# Patient Record
Sex: Female | Born: 1985 | Hispanic: Yes | Marital: Single | Smoking: Former smoker
Health system: Southern US, Community
[De-identification: ages and names within clinical notes are randomized; demographics above are authoritative.]

## PROBLEM LIST (undated history)

## (undated) DIAGNOSIS — F99 Mental disorder, not otherwise specified: Secondary | ICD-10-CM

## (undated) HISTORY — DX: Mental disorder, not otherwise specified: F99

---

## 2011-10-18 ENCOUNTER — Ambulatory Visit
Admission: RE | Admit: 2011-10-18 | Discharge: 2011-10-18 | Disposition: A | Discharge status: Home / Self Care | Payer: PRIVATE HEALTH INSURANCE | Source: Ambulatory Visit | Attending: Family Medicine | Admitting: Family Medicine

## 2011-10-18 ENCOUNTER — Other Ambulatory Visit: Payer: Self-pay | Admitting: Family Medicine

## 2011-10-18 DIAGNOSIS — Z0000 Encounter for general adult medical examination without abnormal findings: Secondary | ICD-10-CM

## 2019-04-30 ENCOUNTER — Other Ambulatory Visit: Payer: Self-pay | Admitting: Internal Medicine

## 2019-04-30 ENCOUNTER — Ambulatory Visit
Admission: RE | Admit: 2019-04-30 | Discharge: 2019-04-30 | Disposition: A | Discharge status: Home / Self Care | Payer: Self-pay | Source: Ambulatory Visit | Attending: Internal Medicine | Admitting: Internal Medicine

## 2019-04-30 ENCOUNTER — Other Ambulatory Visit: Payer: Self-pay

## 2019-04-30 DIAGNOSIS — R7611 Nonspecific reaction to tuberculin skin test without active tuberculosis: Secondary | ICD-10-CM

## 2019-05-06 ENCOUNTER — Other Ambulatory Visit: Payer: No Typology Code available for payment source

## 2019-05-06 ENCOUNTER — Encounter: Payer: Self-pay | Admitting: Family Medicine

## 2019-05-06 ENCOUNTER — Other Ambulatory Visit: Payer: Self-pay

## 2019-05-06 ENCOUNTER — Ambulatory Visit (INDEPENDENT_AMBULATORY_CARE_PROVIDER_SITE_OTHER): Payer: Self-pay | Admitting: Family Medicine

## 2019-05-06 VITALS — BP 108/70 | Ht 66.0 in | Wt 230.2 lb

## 2019-05-06 DIAGNOSIS — Y9315 Activity, underwater diving and snorkeling: Secondary | ICD-10-CM

## 2019-05-06 DIAGNOSIS — Z0189 Encounter for other specified special examinations: Secondary | ICD-10-CM

## 2019-05-06 DIAGNOSIS — R918 Other nonspecific abnormal finding of lung field: Secondary | ICD-10-CM

## 2019-05-06 LAB — POCT UA - GLUCOSE/PROTEIN
Glucose, UA: NEGATIVE
Protein, UA: NEGATIVE

## 2019-05-06 LAB — GLUCOSE, POCT (MANUAL RESULT ENTRY): POC Glucose: 98 mg/dl (ref 70–99)

## 2019-05-06 LAB — POCT HEMOGLOBIN: Hemoglobin: 11.9 g/dL (ref 11–14.6)

## 2019-05-06 NOTE — Progress Notes (Signed)
PCP: Patient, No Pcp Per  Subjective:   HPI: Patient is a 34 y.o. female here for dive physical.  Patient will be diving at [facility]. Has been certified scuba diver since around 2010. She denies history of barotrauma, seizures, pneumothorax, sinus problems, neurologic disorders, cancer, hearing problems and tinnitus, asthma, panic attacks. She wears contacts regularly - vision 20/15 each eye with correction Allergic to sulfa. Smoke <1 pack per week Father with history of diabetes She takes escitalopram 20mg , adderall 20mg , klonazepam 0.5mg  as needed.    History reviewed. No pertinent past medical history.  No current outpatient medications on file prior to visit.   No current facility-administered medications on file prior to visit.    History reviewed. No pertinent surgical history.  Not on File  Social History   Socioeconomic History  . Marital status: Single    Spouse name: Not on file  . Number of children: Not on file  . Years of education: Not on file  . Highest education level: Not on file  Occupational History  . Not on file  Tobacco Use  . Smoking status: Not on file  Substance and Sexual Activity  . Alcohol use: Not on file  . Drug use: Not on file  . Sexual activity: Not on file  Other Topics Concern  . Not on file  Social History Narrative  . Not on file   Social Determinants of Health   Financial Resource Strain:   . Difficulty of Paying Living Expenses: Not on file  Food Insecurity:   . Worried About in the Last Year: Not on file  . Ran Out of Food in the Last Year: Not on file  Transportation Needs:   . Lack of Transportation (Medical): Not on file  . Lack of Transportation (Non-Medical): Not on file  Physical Activity:   . Days of Exercise per Week: Not on file  . Minutes of Exercise per Session: Not on file  Stress:   . Feeling of Stress : Not on file  Social Connections:   . Frequency of  Communication with Friends and Family: Not on file  . Frequency of Social Gatherings with Friends and Family: Not on file  . Attends Religious Services: Not on file  . Active Member of Clubs or Organizations: Not on file  . Attends Meetings: Not on file  . Marital Status: Not on file  Intimate Partner Violence:   . Fear of Current or Ex-Partner: Not on file  . Emotionally Abused: Not on file  . Physically Abused: Not on file  . Sexually Abused: Not on file    History reviewed. No pertinent family history.  BP 108/70   Ht 5\' 6"  (1.676 m)   Wt 230 lb 3.2 oz (104.4 kg)   BMI 37.16 kg/m   Review of Systems: See HPI above.     Objective:  Physical Exam:  Gen: NAD, comfortable in exam room  Vision 20/15 each eye with correction HEENT: EOMI, PERRL. Visual fields normal each eye.  TMs clear without bulging, retraction, or erythema.  Pharynx normal CV: RRR no MRG seated or standing Lungs: CTAB without wheezes, rales, rhonchi Pulses: normal radial pulses bilaterally Abd: soft, nt, nd. No HSM Neuro: CN 2-12 grossly intact MSKs 3+ patellar, achilles, brachioradialis, biceps, triceps tendons bilaterally Strength 5/5 upper and lower extremity muscle groups Romberg negative Double leg stance 0 errors, single leg and tandem 1 error each Finger to nose normal  bilaterally Horizontal saccades and vertical saccades 20 trials without symptoms or nystagmus Fixed gaze with head rotation 20 trials without symptoms or nystagmus   EKG: normal sinus rhythm.  No ST-T wave changes.  No other abnormalities. UA: negative protein and glucose Serum glucose and hemoglobin normal CXR: Opacity visualized at right cardiophrenic angle.    Assessment & Plan:  1. Dive physical - Patient has an opacity at the right cardiophrenic angle on her CXR.  Has had x-rays previously for diving and has not been told of this previously.  Will go ahead with CT with IV contrast to further evaluate.   Will temporarily hold clearance for diving until she completes this workup.

## 2019-05-07 ENCOUNTER — Encounter: Payer: Self-pay | Admitting: Family Medicine

## 2019-05-07 ENCOUNTER — Ambulatory Visit
Admission: RE | Admit: 2019-05-07 | Discharge: 2019-05-07 | Disposition: A | Discharge status: Home / Self Care | Payer: No Typology Code available for payment source | Source: Ambulatory Visit | Attending: Family Medicine | Admitting: Family Medicine

## 2019-05-07 DIAGNOSIS — Y9315 Activity, underwater diving and snorkeling: Secondary | ICD-10-CM

## 2019-05-07 DIAGNOSIS — Z0189 Encounter for other specified special examinations: Secondary | ICD-10-CM

## 2019-05-15 ENCOUNTER — Ambulatory Visit
Admission: RE | Admit: 2019-05-15 | Discharge: 2019-05-15 | Disposition: A | Discharge status: Home / Self Care | Payer: Self-pay | Source: Ambulatory Visit | Attending: Family Medicine | Admitting: Family Medicine

## 2019-05-15 DIAGNOSIS — R918 Other nonspecific abnormal finding of lung field: Secondary | ICD-10-CM

## 2019-05-15 MED ORDER — IOPAMIDOL (ISOVUE-300) INJECTION 61%
75.0000 mL | Freq: Once | INTRAVENOUS | Status: AC | PRN
  Administered 2019-05-15: 15:00:00 75 mL via INTRAVENOUS

## 2020-10-28 ENCOUNTER — Other Ambulatory Visit: Payer: Self-pay

## 2020-10-28 ENCOUNTER — Encounter: Payer: Self-pay | Admitting: Family Medicine

## 2020-10-28 ENCOUNTER — Ambulatory Visit (INDEPENDENT_AMBULATORY_CARE_PROVIDER_SITE_OTHER): Payer: Self-pay | Admitting: Family Medicine

## 2020-10-28 VITALS — BP 138/88 | Ht 66.0 in | Wt 240.0 lb

## 2020-10-28 DIAGNOSIS — Z87898 Personal history of other specified conditions: Secondary | ICD-10-CM

## 2020-10-28 DIAGNOSIS — Z0189 Encounter for other specified special examinations: Secondary | ICD-10-CM

## 2020-10-28 DIAGNOSIS — F17200 Nicotine dependence, unspecified, uncomplicated: Secondary | ICD-10-CM

## 2020-10-28 DIAGNOSIS — Y9315 Activity, underwater diving and snorkeling: Secondary | ICD-10-CM

## 2020-10-28 NOTE — Progress Notes (Signed)
PCP: Patient, No Pcp Per (Inactive)  Subjective:   HPI: Patient is a 35 y.o. female here for dive physical clearance.  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 She denies any history of barotrauma, seizure, pneumothorax, sinus problems, neurologic disorders, cancer, hearing problems, tinnitus, asthma, panic attacks.   She does wear contacts regularly - vision recently was 20/15 each eye with correction.  She did have a follow-up CT scan after a prominent epicardial fat pad noted on x-ray. Her CT showed scattered < 73mm pulmonary nodules, seemingly benign per rads read. She is still smoking cigarettes, but reducing tobacco burden, she is now down to < 1 pack/week.  Patient did have COVID-19 on 09/14/2020. Patient reports her symptoms started with a headache/migraine for 3 days, followed by a few days of head cold/congestion. She reports stuffy/runny nose. She denied experiencing any fever, no anosmia or dysgeusia. She never experienced any chest pain, shortness of breath, or wheezing. Her symptoms lasted total for about 6-7 days and then she was back to usual activity.   Today, patient feels well without any above symptoms. She has been able to exercise, run, workout since COVID-19 and has not had any issues. She has not been diving yet, but is anxious to return.  Her current meds include: Lexapro 20mg , Adderall 20mg   PMHx: - GAD/MDD - Tobacco use disorder  Social History   Socioeconomic History   Marital status: Single    Spouse name: Not on file   Number of children: Not on file   Years of education: Not on file   Highest education level: Not on file  Occupational History   Not on file  Tobacco Use   Smoking status: Every Day    Pack years: 0.00   Smokeless tobacco: Never  Substance and Sexual Activity   Alcohol use: Not on file   Drug use: Not on file   Sexual activity: Not on file  Other Topics Concern   Not on file   Social History Narrative   Not on file   Social Determinants of Health   Financial Resource Strain: Not on file  Food Insecurity: Not on file  Transportation Needs: Not on file  Physical Activity: Not on file  Stress: Not on file  Social Connections: Not on file  Intimate Partner Violence: Not on file    No family history on file.  BP 138/88   Ht 5\' 6"  (1.676 m)   Wt 240 lb (108.9 kg)   BMI 38.74 kg/m   No flowsheet data found.  No flowsheet data found.  Review of Systems: See HPI above.     Objective:  Physical Exam:  Gen: Alert and Oriented x 3, NAD; non-toxic HEENT: Normocephalic, atraumatic, PERRLA, EOMI, TM visible with good light reflex, non-swollen, non-erythematous turbinates, non-erythematous pharyngeal mucosa, no exudates Neck: trachea midline, no thyroidmegaly, no LAD CV: RRR, no murmurs, normal S1, S2 split Resp: CTAB, no wheezing, rales, or rhonchi, comfortable work of breathing Abd: non-distended, non-tender, soft MSK: Moves all four extremities Ext: no clubbing, cyanosis, or edema Neuro: No gross deficits Skin: warm, dry, intact, no rashes    Assessment & Plan:  Dive Physical - cleared today on examination, clear to return paperwork signed by Dr. . Tobacco use disorder - improving, now down to < 1 pack/day. Total of 2 minutes discussed smoking cessation. Resources provided. Pulmonary nodules noted on CT scan 05/14/20 - scattered measuring up to 70mm, most likely  benign given age and characteristics. We did discuss and recommend repeat CXR to follow, however patient would like to hold off on further imaging for now 2/2 cost. We did discuss R/B/I of repeat imaging, patient is aware but would like to hold for now. We discussed repeat imaging (XR > CT) sometime in the next 1-2 years to monitor nodules. She is aware if any symptoms arise, to let us know and we can have this done sooner.   Madelyn Brunner, DO PGY-4, Sports Medicine Fellow [facility]

## 2020-10-28 NOTE — Patient Instructions (Addendum)
It was great to meet you today, thank you for letting me participate in your care!  Today, we discussed:  - clearance for diving --> you are clear to return to diving. When you first go, be aware of your breathing and if there are any differences ( I do not expect this) - great job reducing your smoking!! Keep going on this to hopefully quit completely. This will help with your lungs and overall health. If you need resources, please let us know. - Obtain x-ray to ensure the pulmonary nodules are stable, would recommend this at some point in the future (anywhere from now to 1-2 years)  You will follow-up as needed.  If you have any further questions, please give the clinic a call [phone].  Cheers,  Madelyn Brunner, DO PGY-4, Sports Medicine Fellow [facility]

## 2021-03-13 IMAGING — CR DG CHEST 1V
1 series · 1 of 1 positions shown · non-contrast
Comparison: October 18, 2011

CLINICAL DATA: Positive tuberculin skin test

EXAM:
CHEST  1 VIEW

[w chest pa]
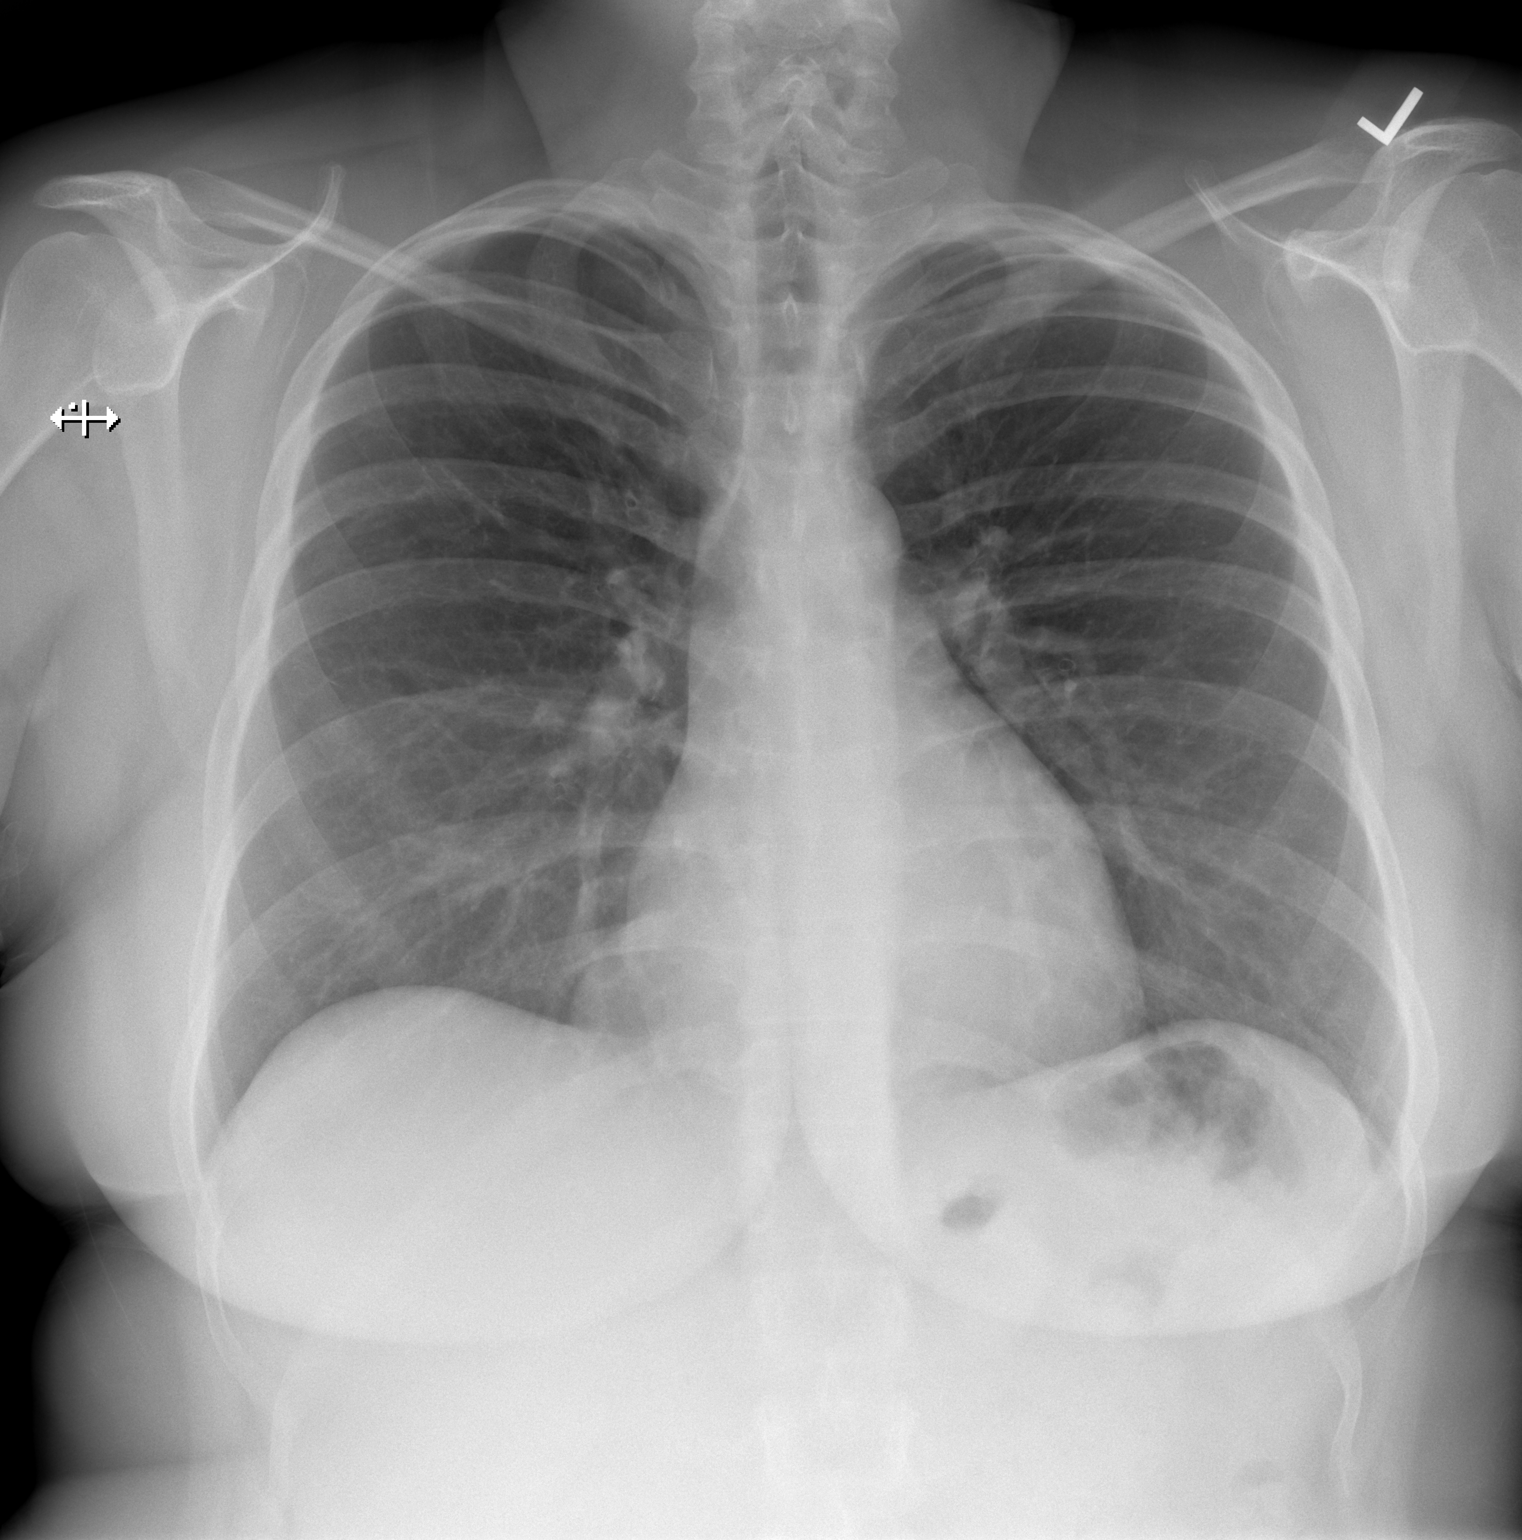

[1 of 1 positions shown; findings below may reference images not displayed]

FINDINGS: There is a nodular appearing opacity at the right heart border level
measuring 3.8 x 2.7 cm. Lungs elsewhere clear. Heart size and
pulmonary vascularity are normal. No adenopathy. No bone lesions.
IMPRESSION: 1. Opacity at the level of the right heart border, at the level of
the medial right hemidiaphragm. This opacity measures 3.8 x 2.7 cm.
This opacity is of uncertain etiology. It could represent a
pericardial cyst or duplication cyst. An atypical neoplasm
conceivably could present in this manner; it would be prudent to
correlate this finding with contrast enhanced chest CT to further
assess.

2. No lung edema or consolidation. No adenopathy. No findings
suggesting pulmonary tuberculosis.

These results will be called to the ordering clinician or
representative by the Radiologist Assistant, and communication
documented in the PACS or zVision Dashboard.

## 2021-03-28 IMAGING — CT CT CHEST W/ CM
2 of 4 series · 11 of 36 positions shown, 13 images · IV contrast (iopamidol)
Comparison: Chest x-ray dated May 07, 2019.

CLINICAL DATA: Possible mediastinal mass.

EXAM:
CT CHEST WITH CONTRAST
TECHNIQUE: Multidetector CT imaging of the chest was performed during
intravenous contrast administration.
CONTRAST:  75mL MLMC70-2RR IOPAMIDOL (MLMC70-2RR) INJECTION 61%

[Series 2: chest 2.00 br40 s3 · axial · 0.59mm/px · z∈[+1596,+1838]mm · 8 of 145 slices shown, 10 images (1 of 2)]
[im 12/145  mediastinal]
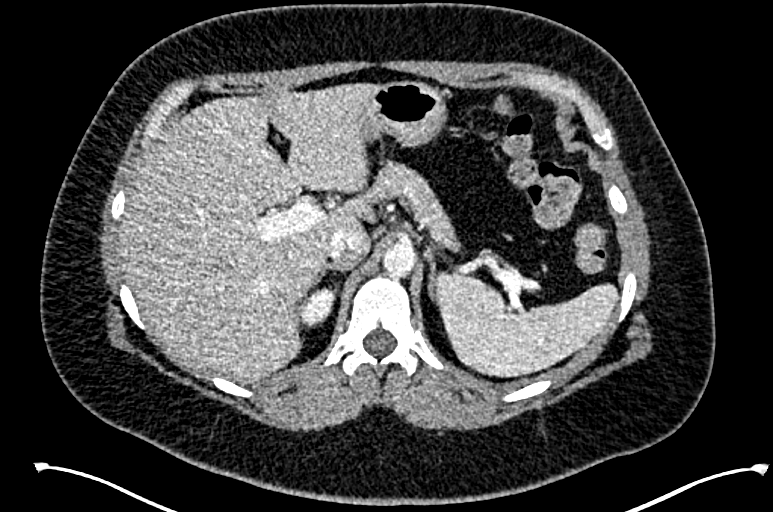
[im 12/145  lung]
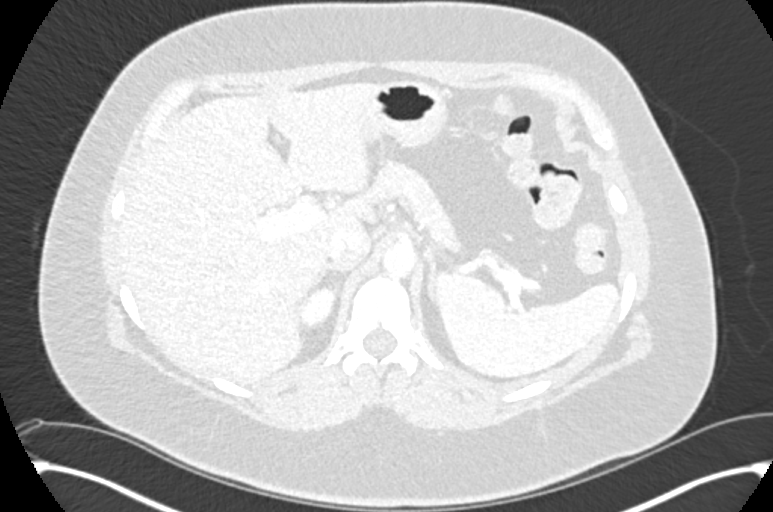
[im 34/145  lung]
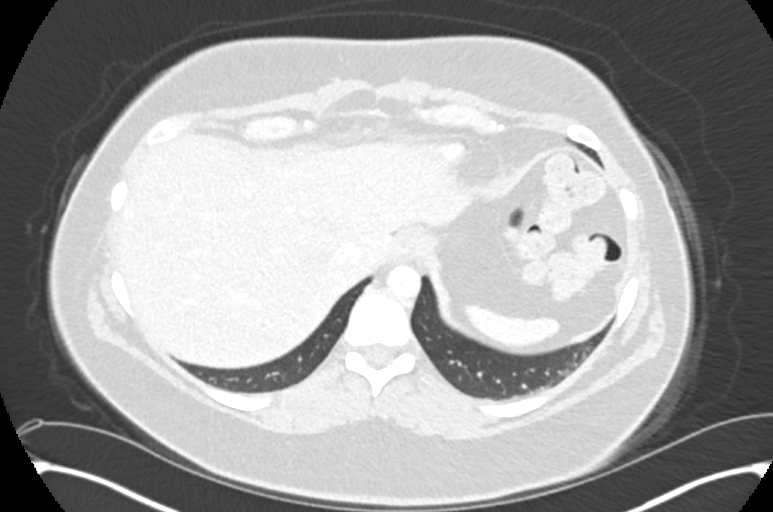
[im 45/145  lung]
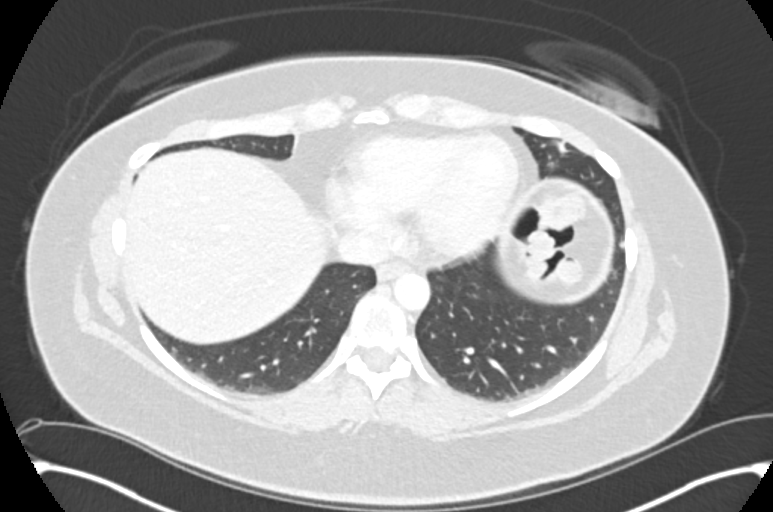
[im 67/145  lung]
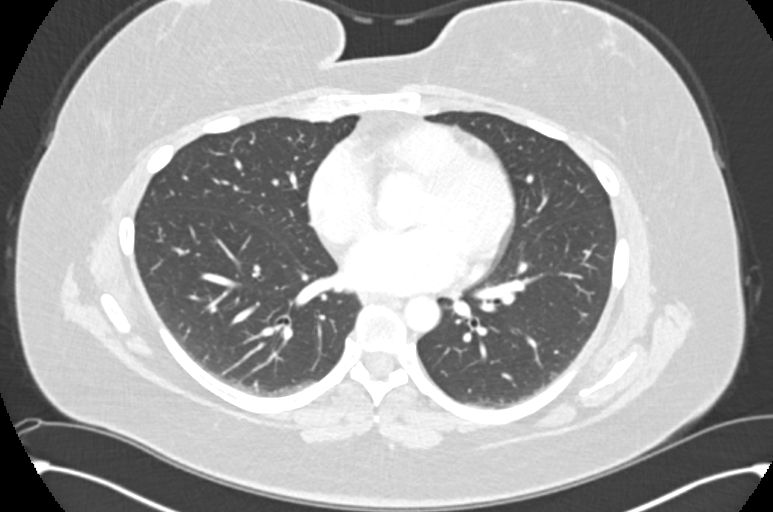
[im 78/145  mediastinal]
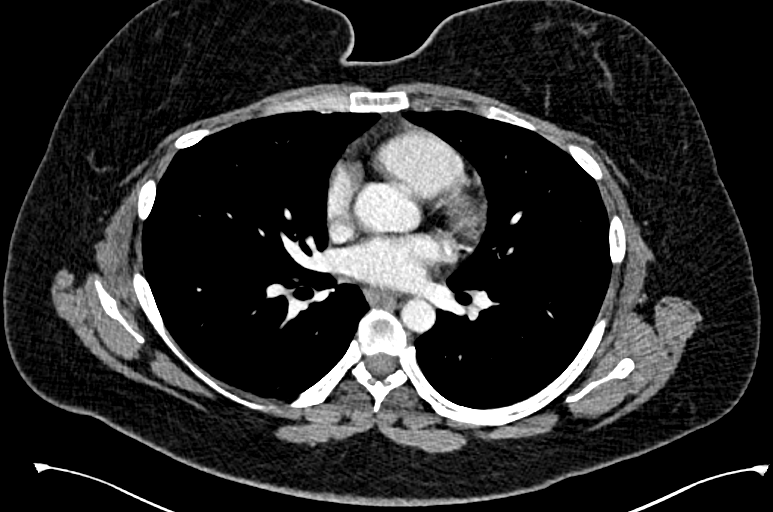
[im 78/145  lung]
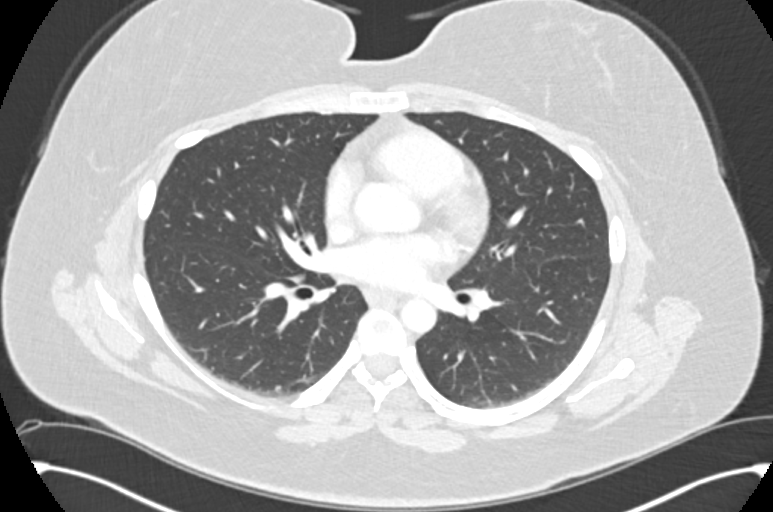
[im 100/145  lung]
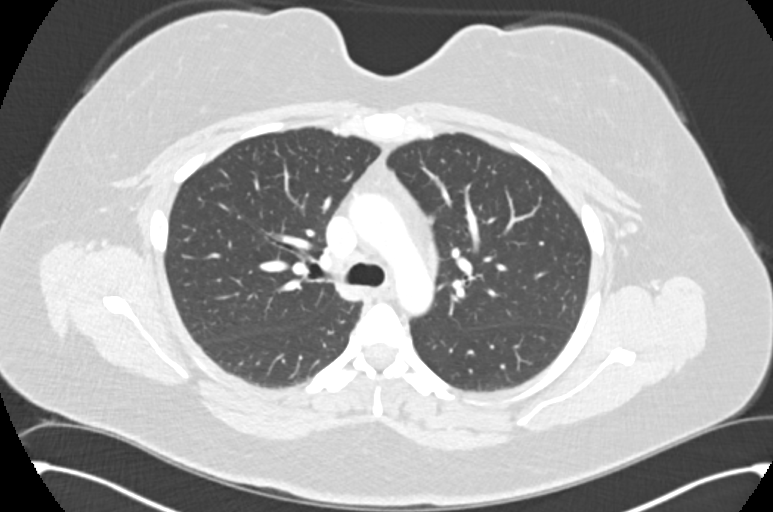
[im 111/145  lung]
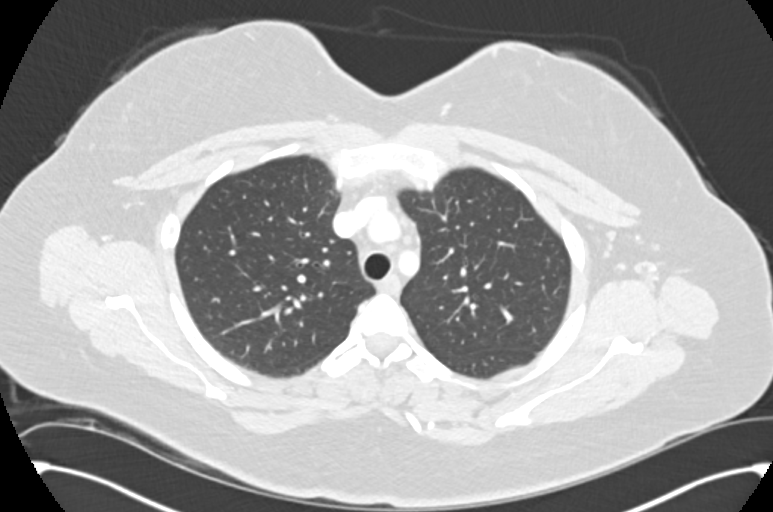
[im 133/145  lung]
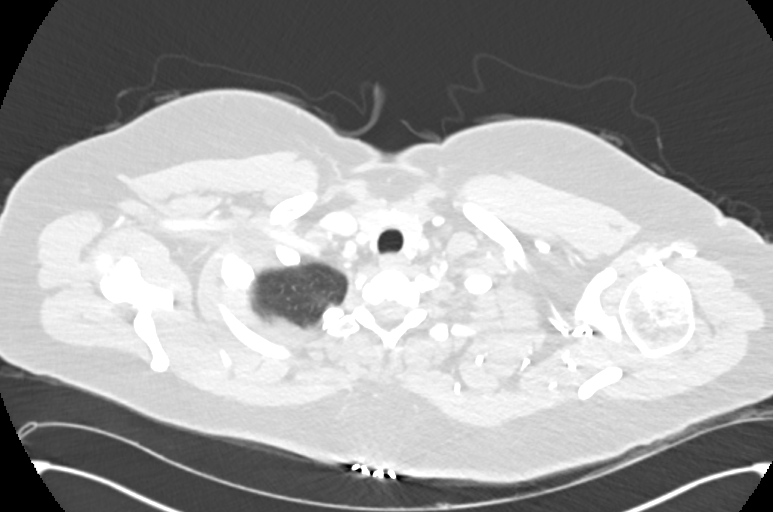

[Series 4: chest 2.00 br40 s3 · coronal · 0.57mm/px · 3 of 150 slices shown (2 of 2)]
[im 30/150  lung]
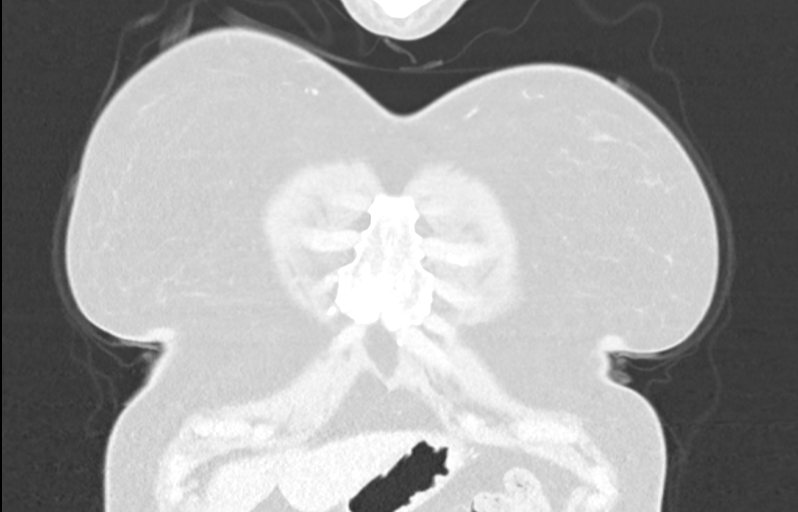
[im 60/150  lung]
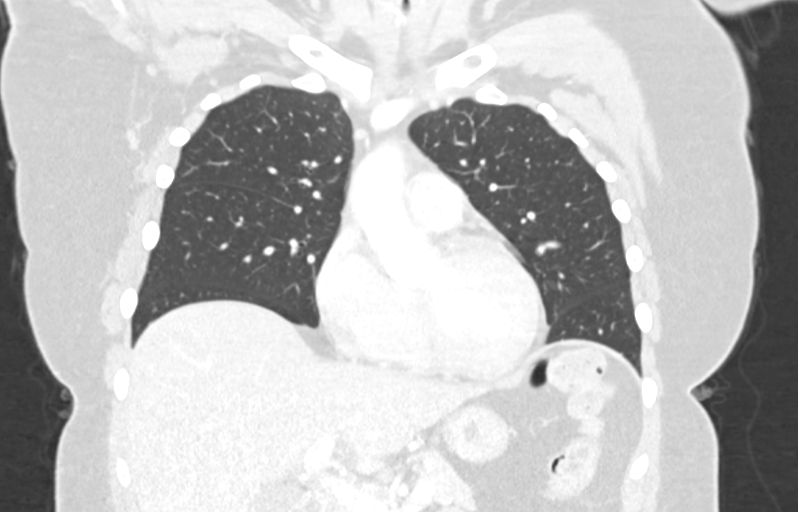
[im 90/150  lung]
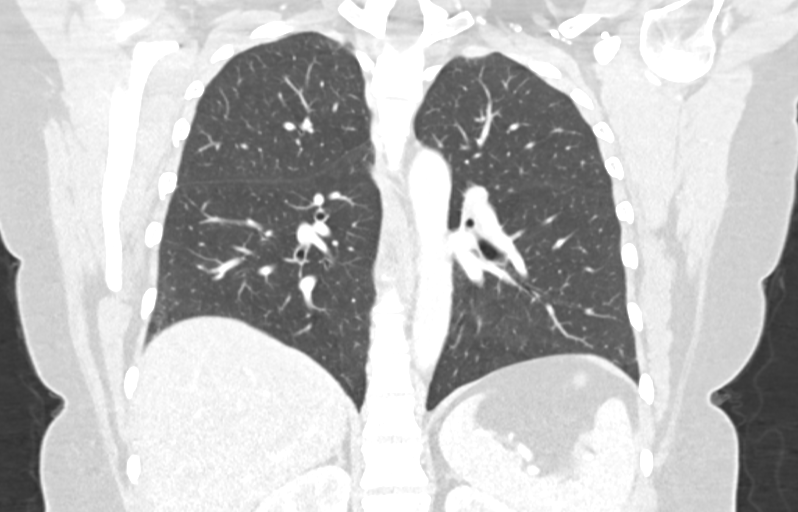

[11 of 36 positions shown; findings below may reference images not displayed]

FINDINGS: Cardiovascular: No significant vascular findings. Normal heart size.
No pericardial effusion. No thoracic aortic aneurysm or dissection.
No central pulmonary embolism.

Mediastinum/Nodes: The density seen on chest x-ray corresponds to
prominent epicardial fat at the right cardiophrenic angle. No
mediastinal mass. No enlarged mediastinal, hilar, or axillary lymph
nodes. Thyroid gland, trachea, and esophagus demonstrate no
significant findings.

Lungs/Pleura: 4 mm pulmonary nodule in the posterior right upper
lobe (series 8, image 27). 4 mm triangular subpleural nodule in the
anterior right middle lobe (series 8, image 75), possibly a lymph
node. 3 mm pulmonary nodule in the posterior right lower lobe
(series 8, image 60). 4 mm pulmonary nodule in the left lower lobe
(series 8, image 56). Focal scarring in the lingula. No focal
consolidation, pleural effusion, or pneumothorax.

Upper Abdomen: No acute abnormality.

Musculoskeletal: No chest wall abnormality. No acute or significant
osseous findings.
IMPRESSION: 1. No mediastinal mass. The density seen on chest x-ray corresponds
to prominent epicardial fat at the right cardiophrenic angle.
2. Scattered pulmonary nodules measuring up to 4 mm. These are
almost certainly benign given the patient's age. Please note that
Fleischner criteria do not apply to patients under the age of 35.
Non-contrast chest CT can be considered in 12 months if patient is
at high-risk for malignancy.

## 2021-05-31 ENCOUNTER — Other Ambulatory Visit: Payer: Self-pay

## 2021-05-31 ENCOUNTER — Encounter: Payer: Self-pay | Admitting: Obstetrics and Gynecology

## 2021-05-31 ENCOUNTER — Other Ambulatory Visit (HOSPITAL_COMMUNITY)
Admission: RE | Admit: 2021-05-31 | Discharge: 2021-05-31 | Disposition: A | Discharge status: Home / Self Care | Payer: 59 | Source: Ambulatory Visit | Attending: Obstetrics and Gynecology | Admitting: Obstetrics and Gynecology

## 2021-05-31 ENCOUNTER — Ambulatory Visit (INDEPENDENT_AMBULATORY_CARE_PROVIDER_SITE_OTHER): Payer: 59 | Admitting: Obstetrics and Gynecology

## 2021-05-31 VITALS — BP 108/73 | HR 74 | Ht 66.0 in | Wt 253.0 lb

## 2021-05-31 DIAGNOSIS — Z3042 Encounter for surveillance of injectable contraceptive: Secondary | ICD-10-CM | POA: Diagnosis not present

## 2021-05-31 DIAGNOSIS — Z01419 Encounter for gynecological examination (general) (routine) without abnormal findings: Secondary | ICD-10-CM | POA: Diagnosis not present

## 2021-05-31 MED ORDER — MEDROXYPROGESTERONE ACETATE 150 MG/ML IM SUSP: 150.0000 mg | INTRAMUSCULAR | 5 refills | Status: DC

## 2021-05-31 MED ORDER — MEDROXYPROGESTERONE ACETATE 150 MG/ML IM SUSP
150.0000 mg | Freq: Once | INTRAMUSCULAR | Status: AC
  Administered 2021-05-31: 150 mg via INTRAMUSCULAR

## 2021-05-31 NOTE — Progress Notes (Addendum)
Pt is in office to discuss changing BC.  Pt is currently on Depo x 5-6 years.  Last injection given 03/24/21.  Pt is unsure of last pap date, would like today.    Office stock depo given today.  Pt advised return for depo May 1-15.  Administrations This Visit     medroxyPROGESTERone (DEPO-PROVERA) injection 150 mg     Admin Date 05/31/2021 Action Given Dose 150 mg Route Intramuscular Administered By Lanney Gins, CMA

## 2021-05-31 NOTE — Progress Notes (Signed)
Subjective:     Bethany Schultz is a 36 y.o. female P0 with LMP 05/17/21 and BMI 40 who is here for a comprehensive physical exam. The patient reports no problems. She is not currently sexually active. She is on depo-provera for cycle control. She is considering changing contraception method as she has been on it for 5 years. She is taking calcium and vitamin D supplements. Patient is otherwise without complaints. She denies pelvic pain or abnormal discharge. Patient declines STI testing  Past Medical History:  Diagnosis Date   Mental disorder    History reviewed. No pertinent surgical history. Family History  Problem Relation Age of Onset   Diabetes Father    Hypothyroidism Mother      Social History   Socioeconomic History   Marital status: Single    Spouse name: Not on file   Number of children: Not on file   Years of education: Not on file   Highest education level: Not on file  Occupational History   Not on file  Tobacco Use   Smoking status: Every Day    Types: Cigarettes   Smokeless tobacco: Never   Tobacco comments:    1-2 cigs/day  Substance and Sexual Activity   Alcohol use: Not Currently   Drug use: Not Currently   Sexual activity: Not on file  Other Topics Concern   Not on file  Social History Narrative   Not on file   Social Determinants of Health   Financial Resource Strain: Not on file  Food Insecurity: Not on file  Transportation Needs: Not on file  Physical Activity: Not on file  Stress: Not on file  Social Connections: Not on file  Intimate Partner Violence: Not on file   Health Maintenance  Topic Date Due   COVID-19 Vaccine (1) Never done   HIV Screening  Never done   Hepatitis C Screening  Never done   TETANUS/TDAP  Never done   PAP SMEAR-Modifier  Never done   INFLUENZA VACCINE  Never done   HPV VACCINES  Aged Out       Review of Systems Pertinent items noted in HPI and remainder of comprehensive ROS otherwise negative.   Objective:   Blood pressure 108/73, pulse 74, height 5\' 6"  (1.676 m), weight 253 lb (114.8 kg), last menstrual period 05/17/2021.   GENERAL: Well-developed, well-nourished female in no acute distress.  HEENT: Normocephalic, atraumatic. Sclerae anicteric.  NECK: Supple. Normal thyroid.  LUNGS: Clear to auscultation bilaterally.  HEART: Regular rate and rhythm. BREASTS: Symmetric in size. No palpable masses or lymphadenopathy, skin changes, or nipple drainage. ABDOMEN: Soft, nontender, nondistended. No organomegaly. PELVIC: Normal external female genitalia. Vagina is pink and rugated.  Normal discharge. Normal appearing cervix. Uterus is normal in size. No adnexal mass or tenderness. Chaperone present during the pelvic exam EXTREMITIES: No cyanosis, clubbing, or edema, 2+ distal pulses.     Assessment:    Healthy female exam.      Plan:    Pap smear collected Contraception options discussed and patient opted to continue with depo-provera Patient will be contacted with abnormal results See After Visit Summary for Counseling Recommendations

## 2021-05-31 NOTE — Addendum Note (Signed)
Addended by: Marya Landry D on: 05/31/2021 10:00 AM   Modules accepted: Orders

## 2021-06-02 LAB — CYTOLOGY - PAP
Adequacy: ABSENT
Comment: NEGATIVE
Diagnosis: NEGATIVE
High risk HPV: NEGATIVE

## 2021-08-27 ENCOUNTER — Ambulatory Visit (INDEPENDENT_AMBULATORY_CARE_PROVIDER_SITE_OTHER): Payer: 59 | Admitting: *Deleted

## 2021-08-27 DIAGNOSIS — Z3042 Encounter for surveillance of injectable contraceptive: Secondary | ICD-10-CM | POA: Diagnosis not present

## 2021-08-27 MED ORDER — MEDROXYPROGESTERONE ACETATE 150 MG/ML IM SUSP
150.0000 mg | Freq: Once | INTRAMUSCULAR | Status: AC
  Administered 2021-08-27: 150 mg via INTRAMUSCULAR

## 2021-08-27 NOTE — Progress Notes (Signed)
Date last pap: 05/31/21. ?Last Depo-Provera: 05/31/21. ?Side Effects if any: N/A. ?Depo-Provera 150 mg IM given by: S.Malen Gauze, CMA. ?Next appointment due 7/28-8/11/23. ? ?Administrations This Visit   ? ? medroxyPROGESTERone (DEPO-PROVERA) injection 150 mg   ? ? Admin Date ?08/27/2021 Action ?Given Dose ?150 mg Route ?Intramuscular Administered By ?Marya Landry D, CMA  ? ?  ?  ? ?  ? ? ? ?

## 2021-11-19 ENCOUNTER — Ambulatory Visit (INDEPENDENT_AMBULATORY_CARE_PROVIDER_SITE_OTHER): Payer: 59

## 2021-11-19 VITALS — BP 116/77 | HR 80

## 2021-11-19 DIAGNOSIS — Z3042 Encounter for surveillance of injectable contraceptive: Secondary | ICD-10-CM | POA: Diagnosis not present

## 2021-11-19 MED ORDER — MEDROXYPROGESTERONE ACETATE 150 MG/ML IM SUSP
150.0000 mg | Freq: Once | INTRAMUSCULAR | Status: AC
  Administered 2021-11-19: 150 mg via INTRAMUSCULAR

## 2021-11-19 NOTE — Addendum Note (Signed)
Addended by: Dalphine Handing on: 11/19/2021 09:07 AM   Modules accepted: Orders

## 2021-11-19 NOTE — Progress Notes (Addendum)
Subjective:  Pt in for Depo Provera injection.    Objective: Need for contraception. No unusual complaints.    Assessment: Depo given L Del. Pt tolerated Depo injection.   Plan: Next injection scheduled 02/04/22.  Administrations This Visit     medroxyPROGESTERone (DEPO-PROVERA) injection 150 mg     Admin Date 11/19/2021 Action Given Dose 150 mg Route Intramuscular Administered By Lewayne Bunting, CMA

## 2022-02-04 ENCOUNTER — Ambulatory Visit (INDEPENDENT_AMBULATORY_CARE_PROVIDER_SITE_OTHER): Payer: 59 | Admitting: General Practice

## 2022-02-04 DIAGNOSIS — Z3042 Encounter for surveillance of injectable contraceptive: Secondary | ICD-10-CM

## 2022-02-04 MED ORDER — MEDROXYPROGESTERONE ACETATE 150 MG/ML IM SUSP
150.0000 mg | INTRAMUSCULAR | Status: AC
  Administered 2022-02-04 – 2024-03-01 (×6): 150 mg via INTRAMUSCULAR

## 2022-02-04 NOTE — Progress Notes (Signed)
Date last pap: 05-31-21. Last Depo-Provera: 11-19-21. Side Effects if any: Pt tolerated well. Serum HCG indicated? Depo given on schedule. Depo-Provera 150 mg IM given by: Arlie Solomons, CMA in the left deltoid per pt request. Next appointment due 1/5-1/19.

## 2022-04-29 ENCOUNTER — Ambulatory Visit (INDEPENDENT_AMBULATORY_CARE_PROVIDER_SITE_OTHER): Payer: Managed Care, Other (non HMO)

## 2022-04-29 DIAGNOSIS — Z3042 Encounter for surveillance of injectable contraceptive: Secondary | ICD-10-CM

## 2022-04-29 NOTE — Progress Notes (Signed)
Date last pap: 05/31/21. Last Depo-Provera: 02/04/22. Side Effects if any: Pt tolerated well. Serum HCG indicated? Depo given on schedule. Depo-Provera 150 mg IM given by: Sarissa Dern D, LPN in left deltoid . Next appointment due 3/30 - 4/13.

## 2022-06-24 ENCOUNTER — Ambulatory Visit: Payer: Managed Care, Other (non HMO)

## 2022-06-24 VITALS — BP 112/74 | HR 79 | Ht 66.0 in | Wt 236.2 lb

## 2022-06-24 DIAGNOSIS — Z1339 Encounter for screening examination for other mental health and behavioral disorders: Secondary | ICD-10-CM | POA: Diagnosis not present

## 2022-06-24 DIAGNOSIS — Z01419 Encounter for gynecological examination (general) (routine) without abnormal findings: Secondary | ICD-10-CM | POA: Diagnosis not present

## 2022-06-24 DIAGNOSIS — Z3042 Encounter for surveillance of injectable contraceptive: Secondary | ICD-10-CM | POA: Diagnosis not present

## 2022-06-24 DIAGNOSIS — Z0000 Encounter for general adult medical examination without abnormal findings: Secondary | ICD-10-CM

## 2022-06-24 NOTE — Progress Notes (Unsigned)
GYNECOLOGY OFFICE VISIT NOTE-WELL WOMAN EXAM  History:   Bethany Schultz [id number] here today for well woman exam. She reports no issues and is experiencing some spotting with her Depo Provera injection prior to when her next dosage is due.  She denies any abnormal vaginal discharge, bleeding, or pelvic pain.    Birth Control:  Depo Provera-Satisified  Reproductive Concerns Sexually Active: No Partners Type: N/A Number of partners in last year: N/A STD Testing: Declines  Vaginal/GU Concerns: No vaginal concerns.  No issues with urination. Reports some GI issues that she is being managed by her PCP.  Breast Concerns/Exams: No breast concerns. Reports breast exams once monthly. Endorses SBA. Denies family history of breast, uterine, cervical, or ovarian cancer  Medical and Nutrition PCP: [facility]. Appt next week Significant PMx: Ulcers Exercise:Walking, Active at work Tobacco/Drugs/Alcohol: Current smoker, but has decreased to one cigarette a week. No drug or alcohol usage.  Nutrition: Reports "getting better" when asked about balanced intake.   Social Safety at home: [place]: [place]  Past Medical History:  Diagnosis Date   Mental disorder     History reviewed. No pertinent surgical history.  The following portions of the patient's history were reviewed and updated as appropriate: allergies, current medications, past family history, past medical history, past social history, past surgical history and problem list.   Health Maintenance:  Normal pap and negative HRHPV on Feb 2023.  No mammogram on file d/t age.   Review of Systems:  Pertinent items noted in HPI and remainder of comprehensive ROS otherwise negative.    Objective:    Physical Exam BP 112/74   Pulse 79   Ht '5\' 6"'$  (1.676 m)   Wt 236 lb 3.2 oz (107.1 kg)   BMI 38.12 kg/m  Physical Exam Vitals reviewed.   Constitutional:      General: She is not in acute distress.    Appearance: Normal appearance. She is not toxic-appearing.  HENT:     Head: Normocephalic and atraumatic.  Eyes:     Conjunctiva/sclera: Conjunctivae normal.  Cardiovascular:     Rate and Rhythm: Normal rate.  Pulmonary:     Effort: Pulmonary effort is normal.     Breath sounds: Normal breath sounds.  Chest:     Comments: CBE Deferred Abdominal:     General: Bowel sounds are normal.     Tenderness: There is no abdominal tenderness.  Genitourinary:    General: Normal vulva.  Musculoskeletal:        General: Normal range of motion.     Cervical back: Normal range of motion.  Skin:    General: Skin is warm and dry.  Neurological:     Mental Status: She is alert and oriented to person, place, and time.  Psychiatric:        Mood and Affect: Mood normal.        Behavior: Behavior normal.     Labs and Imaging No results found for this or any previous visit (from the past 168 hour(s)). No results found.   Assessment & Plan:  37 year old Well Woman Exam Pap UTD Depo Provera Maintenance  1. Well woman exam without gynecological exam -Exam performed and findings discussed. -Educated on ASCCP guidelines regarding pap smear evaluation and frequency. -Informed pap UTD and no need to repeat currently.  Plan to repeat between 2026-2028. -Encouraged to activate and utilize Mychart for reviewing of results, communication with office, and  scheduling of appts. -Educated on AHA exercise recommendations of 30 minutes of moderate to vigorous activity at least 5x/week. -Educated and encouraged to continue monthly SBE with increased breast awareness including examination of breast for skin changes, moles, tenderness, etc.    2. Surveillance for Depo-Provera contraception -Doing well. -Refill sent. -Consider Vitamin D and calcium supplements   Routine preventative health maintenance measures emphasized. Please refer to  After Visit Summary for other counseling recommendations.   Return in about 1 year (around 06/24/2023) for Annual Visit.      Maryann Conners, CNM 06/24/2022

## 2022-06-24 NOTE — Progress Notes (Unsigned)
Pt presents for AEX. Last PAP 05/2021. Declines STD testing. Needs Depo Refills. No other concerns.

## 2022-06-28 MED ORDER — MEDROXYPROGESTERONE ACETATE 150 MG/ML IM SUSP: 150.0000 mg | INTRAMUSCULAR | 5 refills | Status: AC

## 2022-07-22 ENCOUNTER — Ambulatory Visit (INDEPENDENT_AMBULATORY_CARE_PROVIDER_SITE_OTHER): Payer: Managed Care, Other (non HMO)

## 2022-07-22 VITALS — Wt 242.0 lb

## 2022-07-22 DIAGNOSIS — Z3042 Encounter for surveillance of injectable contraceptive: Secondary | ICD-10-CM | POA: Diagnosis not present

## 2022-07-22 NOTE — Progress Notes (Signed)
Pt is in the office for depo injection. Administered in L Del, per pt request, and pt tolerated well. Next due June 21- July 5 .. Administrations This Visit     medroxyPROGESTERone (DEPO-PROVERA) injection 150 mg     Admin Date 07/22/2022 Action Given Dose 150 mg Route Intramuscular Administered By Katrina Stack, RN

## 2022-10-14 ENCOUNTER — Ambulatory Visit: Payer: Managed Care, Other (non HMO)

## 2022-10-14 VITALS — BP 125/81 | HR 74 | Ht 66.0 in | Wt 241.0 lb

## 2022-10-14 DIAGNOSIS — Z3042 Encounter for surveillance of injectable contraceptive: Secondary | ICD-10-CM

## 2022-10-14 MED ORDER — MEDROXYPROGESTERONE ACETATE 150 MG/ML IM SUSP
150.0000 mg | Freq: Once | INTRAMUSCULAR | Status: AC
  Administered 2022-10-14: 150 mg via INTRAMUSCULAR

## 2022-10-14 NOTE — Progress Notes (Signed)
  Date last PAP: 05/31/2021. Last Depo-Provera: 07/22/22. Side Effects if any: NONE. Serum HCG indicated? NA.  Depo-Provera 150 mg IM given by: Georgana Curio, CMA in LD, tolerated well.  Next appointment due Sept. 13 - 27, 2024.   Administrations This Visit     medroxyPROGESTERone (DEPO-PROVERA) injection 150 mg     Admin Date 10/14/2022 Action Given Dose 150 mg Route Intramuscular Administered By Maretta Bees, RMA

## 2023-01-06 ENCOUNTER — Ambulatory Visit (INDEPENDENT_AMBULATORY_CARE_PROVIDER_SITE_OTHER): Payer: Managed Care, Other (non HMO) | Admitting: General Practice

## 2023-01-06 VITALS — BP 108/73 | HR 78 | Ht 66.0 in | Wt 242.0 lb

## 2023-01-06 DIAGNOSIS — Z3042 Encounter for surveillance of injectable contraceptive: Secondary | ICD-10-CM | POA: Diagnosis not present

## 2023-01-06 MED ORDER — MEDROXYPROGESTERONE ACETATE 150 MG/ML IM SUSP
150.0000 mg | Freq: Once | INTRAMUSCULAR | Status: AC
  Administered 2023-01-06: 150 mg via INTRAMUSCULAR

## 2023-01-06 NOTE — Progress Notes (Signed)
Date last pap: 05-31-2021. Last Depo-Provera: 10-14-22. Side Effects if any: Pt tolerated well. Serum HCG indicated? N/A Depo given on schedule. Depo-Provera 150 mg IM given by: Hope Pigeon, CMA in the LD per pt request. Next appointment due 12/6-12/20.

## 2023-03-21 DIAGNOSIS — F331 Major depressive disorder, recurrent, moderate: Secondary | ICD-10-CM | POA: Diagnosis not present

## 2023-03-21 DIAGNOSIS — F900 Attention-deficit hyperactivity disorder, predominantly inattentive type: Secondary | ICD-10-CM | POA: Diagnosis not present

## 2023-03-21 DIAGNOSIS — F411 Generalized anxiety disorder: Secondary | ICD-10-CM | POA: Diagnosis not present

## 2023-03-31 ENCOUNTER — Ambulatory Visit (INDEPENDENT_AMBULATORY_CARE_PROVIDER_SITE_OTHER): Payer: BC Managed Care – PPO | Admitting: *Deleted

## 2023-03-31 DIAGNOSIS — Z3042 Encounter for surveillance of injectable contraceptive: Secondary | ICD-10-CM | POA: Diagnosis not present

## 2023-03-31 NOTE — Progress Notes (Signed)
Date last pap: 05/31/21. Last Depo-Provera: 01/06/23. Side Effects if any: none. Serum HCG indicated? none. Depo-Provera 150 mg IM given by: Mercy Riding, CMA.  Next appointment due 2/28-3/14/25.  Administrations This Visit     medroxyPROGESTERone (DEPO-PROVERA) injection 150 mg     Admin Date 03/31/2023 Action Given Dose 150 mg Route Intramuscular Documented By Lanney Gins, CMA

## 2023-04-03 DIAGNOSIS — F331 Major depressive disorder, recurrent, moderate: Secondary | ICD-10-CM | POA: Diagnosis not present

## 2023-04-03 DIAGNOSIS — F429 Obsessive-compulsive disorder, unspecified: Secondary | ICD-10-CM | POA: Diagnosis not present

## 2023-04-17 DIAGNOSIS — F331 Major depressive disorder, recurrent, moderate: Secondary | ICD-10-CM | POA: Diagnosis not present

## 2023-04-17 DIAGNOSIS — F411 Generalized anxiety disorder: Secondary | ICD-10-CM | POA: Diagnosis not present

## 2023-04-17 DIAGNOSIS — F900 Attention-deficit hyperactivity disorder, predominantly inattentive type: Secondary | ICD-10-CM | POA: Diagnosis not present

## 2023-05-17 DIAGNOSIS — F900 Attention-deficit hyperactivity disorder, predominantly inattentive type: Secondary | ICD-10-CM | POA: Diagnosis not present

## 2023-05-17 DIAGNOSIS — F331 Major depressive disorder, recurrent, moderate: Secondary | ICD-10-CM | POA: Diagnosis not present

## 2023-05-17 DIAGNOSIS — F411 Generalized anxiety disorder: Secondary | ICD-10-CM | POA: Diagnosis not present

## 2023-06-14 DIAGNOSIS — F331 Major depressive disorder, recurrent, moderate: Secondary | ICD-10-CM | POA: Diagnosis not present

## 2023-06-14 DIAGNOSIS — F900 Attention-deficit hyperactivity disorder, predominantly inattentive type: Secondary | ICD-10-CM | POA: Diagnosis not present

## 2023-06-14 DIAGNOSIS — F411 Generalized anxiety disorder: Secondary | ICD-10-CM | POA: Diagnosis not present

## 2023-06-16 ENCOUNTER — Encounter: Payer: Self-pay | Admitting: Obstetrics and Gynecology

## 2023-06-16 ENCOUNTER — Ambulatory Visit: Payer: BC Managed Care – PPO | Admitting: Obstetrics and Gynecology

## 2023-06-16 VITALS — BP 117/81 | HR 78 | Wt 252.4 lb

## 2023-06-16 DIAGNOSIS — Z3042 Encounter for surveillance of injectable contraceptive: Secondary | ICD-10-CM

## 2023-06-16 DIAGNOSIS — Z01419 Encounter for gynecological examination (general) (routine) without abnormal findings: Secondary | ICD-10-CM | POA: Diagnosis not present

## 2023-06-16 MED ORDER — MEDROXYPROGESTERONE ACETATE 150 MG/ML IM SUSP
150.0000 mg | Freq: Once | INTRAMUSCULAR | Status: AC
  Administered 2023-06-16: 150 mg via INTRAMUSCULAR

## 2023-06-16 MED ORDER — MEDROXYPROGESTERONE ACETATE 150 MG/ML IM SUSP: 150.0000 mg | INTRAMUSCULAR | 4 refills | Status: DC

## 2023-06-16 NOTE — Progress Notes (Signed)
 Subjective:     Bethany Schultz is a 38 y.o. female P18 with amenorrhea secondary to depo-provera and BMI 40 who is here for a comprehensive physical exam. The patient reports no problems. She is currently not sexually active. She denies pelvic pain or abnormal discharge. She is without any other complaints. She reports normal health maintenance labs with PCP in the past year  Past Medical History:  Diagnosis Date   Mental disorder    History reviewed. No pertinent surgical history. Family History  Problem Relation Age of Onset   Diabetes Father    Hypothyroidism Mother     Social History   Socioeconomic History   Marital status: Single    Spouse name: Not on file   Number of children: Not on file   Years of education: Not on file   Highest education level: Not on file  Occupational History   Not on file  Tobacco Use   Smoking status: Former    Types: Cigarettes    Passive exposure: Current   Smokeless tobacco: Never   Tobacco comments:    1-2 cigs/day  Vaping Use   Vaping status: Never Used  Substance and Sexual Activity   Alcohol use: Not Currently   Drug use: Not Currently   Sexual activity: Not Currently    Birth control/protection: Injection  Other Topics Concern   Not on file  Social History Narrative   Not on file   Social Drivers of Health   Financial Resource Strain: Not on file  Food Insecurity: Not on file  Transportation Needs: Not on file  Physical Activity: Not on file  Stress: Not on file  Social Connections: Unknown (08/27/2021)   Received from [facility]   Social Network    Social Network: Not on file  Intimate Partner Violence: Unknown (07/23/2021)   Received from [facility], [facility]   HITS    Physically Hurt: Not on file    Insult or Talk Down To: Not on file    Threaten Physical Harm: Not on file    Scream or Curse: Not on file   Health Maintenance  Topic Date Due   HIV Screening  Never done   Hepatitis C  Screening  Never done   DTaP/Tdap/Td (1 - Tdap) Never done   INFLUENZA VACCINE  11/17/2022   COVID-19 Vaccine (1 - 2024-25 season) Never done   Cervical Cancer Screening (HPV/Pap Cotest)  05/31/2026   HPV VACCINES  Aged Out       Review of Systems Pertinent items noted in HPI and remainder of comprehensive ROS otherwise negative.   Objective:  Blood pressure 117/81, pulse 78, weight 252 lb 6.4 oz (114.5 kg).   GENERAL: Well-developed, well-nourished female in no acute distress.  HEENT: Normocephalic, atraumatic. Sclerae anicteric.  NECK: Supple. Normal thyroid.  LUNGS: Clear to auscultation bilaterally.  HEART: Regular rate and rhythm. BREASTS: Symmetric in size. No palpable masses or lymphadenopathy, skin changes, or nipple drainage. ABDOMEN: Soft, nontender, nondistended. No organomegaly. PELVIC: Normal external female genitalia. Vagina is pink and rugated.  Normal discharge. Uterus is normal in size. No adnexal mass or tenderness. Chaperone present during the pelvic exam EXTREMITIES: No cyanosis, clubbing, or edema, 2+ distal pulses.     Assessment:    Healthy female exam.      Plan:    Pap smear current. Patient opted to have it done next year Refill on depo-provera provided Patient declined STI screening RTC in a year for annual exam  with pap smear See After Visit Summary for Counseling Recommendations

## 2023-06-16 NOTE — Progress Notes (Signed)
 Pt presents for AEX. Pap UTD, not due til 2026. Declines all STD testing. Depo today.

## 2023-07-12 DIAGNOSIS — F331 Major depressive disorder, recurrent, moderate: Secondary | ICD-10-CM | POA: Diagnosis not present

## 2023-07-12 DIAGNOSIS — F411 Generalized anxiety disorder: Secondary | ICD-10-CM | POA: Diagnosis not present

## 2023-07-12 DIAGNOSIS — F900 Attention-deficit hyperactivity disorder, predominantly inattentive type: Secondary | ICD-10-CM | POA: Diagnosis not present

## 2023-08-09 DIAGNOSIS — F411 Generalized anxiety disorder: Secondary | ICD-10-CM | POA: Diagnosis not present

## 2023-08-09 DIAGNOSIS — F900 Attention-deficit hyperactivity disorder, predominantly inattentive type: Secondary | ICD-10-CM | POA: Diagnosis not present

## 2023-08-09 DIAGNOSIS — F331 Major depressive disorder, recurrent, moderate: Secondary | ICD-10-CM | POA: Diagnosis not present

## 2023-09-08 ENCOUNTER — Ambulatory Visit: Payer: BC Managed Care – PPO

## 2023-09-15 ENCOUNTER — Ambulatory Visit

## 2023-09-15 DIAGNOSIS — Z3042 Encounter for surveillance of injectable contraceptive: Secondary | ICD-10-CM | POA: Diagnosis not present

## 2023-09-15 MED ORDER — MEDROXYPROGESTERONE ACETATE 150 MG/ML IM SUSP
150.0000 mg | Freq: Once | INTRAMUSCULAR | Status: AC
  Administered 2023-09-15: 150 mg via INTRAMUSCULAR

## 2023-09-15 NOTE — Progress Notes (Signed)
 Date last pap: 05/31/21. Last Depo-Provera : 06/16/23. Side Effects if any: NA. Serum HCG indicated? NA. Depo-Provera  150 mg IM given by: Artemio Bilberry,  RN. Next appointment due August 15-29 2025.

## 2023-09-18 DIAGNOSIS — F429 Obsessive-compulsive disorder, unspecified: Secondary | ICD-10-CM | POA: Diagnosis not present

## 2023-09-18 DIAGNOSIS — F331 Major depressive disorder, recurrent, moderate: Secondary | ICD-10-CM | POA: Diagnosis not present

## 2023-10-04 DIAGNOSIS — F411 Generalized anxiety disorder: Secondary | ICD-10-CM | POA: Diagnosis not present

## 2023-10-04 DIAGNOSIS — F900 Attention-deficit hyperactivity disorder, predominantly inattentive type: Secondary | ICD-10-CM | POA: Diagnosis not present

## 2023-10-04 DIAGNOSIS — F331 Major depressive disorder, recurrent, moderate: Secondary | ICD-10-CM | POA: Diagnosis not present

## 2023-11-02 DIAGNOSIS — F331 Major depressive disorder, recurrent, moderate: Secondary | ICD-10-CM | POA: Diagnosis not present

## 2023-11-02 DIAGNOSIS — F411 Generalized anxiety disorder: Secondary | ICD-10-CM | POA: Diagnosis not present

## 2023-11-02 DIAGNOSIS — F900 Attention-deficit hyperactivity disorder, predominantly inattentive type: Secondary | ICD-10-CM | POA: Diagnosis not present

## 2023-11-29 DIAGNOSIS — F331 Major depressive disorder, recurrent, moderate: Secondary | ICD-10-CM | POA: Diagnosis not present

## 2023-11-29 DIAGNOSIS — F411 Generalized anxiety disorder: Secondary | ICD-10-CM | POA: Diagnosis not present

## 2023-11-29 DIAGNOSIS — F900 Attention-deficit hyperactivity disorder, predominantly inattentive type: Secondary | ICD-10-CM | POA: Diagnosis not present

## 2023-12-01 ENCOUNTER — Ambulatory Visit

## 2023-12-01 VITALS — BP 100/71 | HR 84 | Wt 254.8 lb

## 2023-12-01 DIAGNOSIS — Z3042 Encounter for surveillance of injectable contraceptive: Secondary | ICD-10-CM

## 2023-12-01 NOTE — Progress Notes (Signed)
 Pt is in the office for depo injection. Administered in [place] (pt preference) and pt tolerated well. Next due Oct 31- Nov 14 .SABRA Administrations This Visit     medroxyPROGESTERone  (DEPO-PROVERA ) injection 150 mg     Admin Date 12/01/2023 Action Given Dose 150 mg Route Intramuscular Documented By Doneta Laymon BIRCH, RN

## 2023-12-19 DIAGNOSIS — F429 Obsessive-compulsive disorder, unspecified: Secondary | ICD-10-CM | POA: Diagnosis not present

## 2023-12-19 DIAGNOSIS — F331 Major depressive disorder, recurrent, moderate: Secondary | ICD-10-CM | POA: Diagnosis not present

## 2023-12-27 DIAGNOSIS — F411 Generalized anxiety disorder: Secondary | ICD-10-CM | POA: Diagnosis not present

## 2023-12-27 DIAGNOSIS — F331 Major depressive disorder, recurrent, moderate: Secondary | ICD-10-CM | POA: Diagnosis not present

## 2023-12-27 DIAGNOSIS — F900 Attention-deficit hyperactivity disorder, predominantly inattentive type: Secondary | ICD-10-CM | POA: Diagnosis not present

## 2024-01-25 DIAGNOSIS — F900 Attention-deficit hyperactivity disorder, predominantly inattentive type: Secondary | ICD-10-CM | POA: Diagnosis not present

## 2024-01-25 DIAGNOSIS — F331 Major depressive disorder, recurrent, moderate: Secondary | ICD-10-CM | POA: Diagnosis not present

## 2024-01-25 DIAGNOSIS — F429 Obsessive-compulsive disorder, unspecified: Secondary | ICD-10-CM | POA: Diagnosis not present

## 2024-02-21 DIAGNOSIS — F900 Attention-deficit hyperactivity disorder, predominantly inattentive type: Secondary | ICD-10-CM | POA: Diagnosis not present

## 2024-02-21 DIAGNOSIS — F331 Major depressive disorder, recurrent, moderate: Secondary | ICD-10-CM | POA: Diagnosis not present

## 2024-03-01 ENCOUNTER — Ambulatory Visit (INDEPENDENT_AMBULATORY_CARE_PROVIDER_SITE_OTHER)

## 2024-03-01 VITALS — BP 117/80 | HR 81 | Wt 262.0 lb

## 2024-03-01 DIAGNOSIS — Z3042 Encounter for surveillance of injectable contraceptive: Secondary | ICD-10-CM | POA: Diagnosis not present

## 2024-03-01 NOTE — Progress Notes (Signed)
 Pt is in the office for depo injection Administered in L Del and pt tolerated well Next due Jan 30- Feb 13 .SABRA Administrations This Visit     medroxyPROGESTERone  (DEPO-PROVERA ) injection 150 mg     Admin Date 03/01/2024 Action Given Dose 150 mg Route Intramuscular Documented By Doneta Laymon BIRCH, RN

## 2024-05-17 ENCOUNTER — Ambulatory Visit

## 2024-05-17 VITALS — BP 121/81 | HR 82 | Ht 66.0 in | Wt 279.2 lb

## 2024-05-17 DIAGNOSIS — Z3042 Encounter for surveillance of injectable contraceptive: Secondary | ICD-10-CM | POA: Diagnosis not present

## 2024-05-17 MED ORDER — MEDROXYPROGESTERONE ACETATE 150 MG/ML IM SUSP: 150.0000 mg | INTRAMUSCULAR | 1 refills | Status: AC

## 2024-05-17 MED ORDER — MEDROXYPROGESTERONE ACETATE 150 MG/ML IM SUSY
1.0000 mL | PREFILLED_SYRINGE | Freq: Once | INTRAMUSCULAR | Status: AC
  Administered 2024-05-17: 150 mg via INTRAMUSCULAR

## 2024-05-17 NOTE — Progress Notes (Signed)
 Date last pap: 05/31/2021. Last Depo-Provera : 03/01/2024. Side Effects if any: none reported. Serum HCG indicated? no. Depo-Provera  150 mg IM given by: Orlene Deidra Bang, RN. Next appointment due 4/18-5/2 2026.  Pt due for annual with next visit. Pt aware  Administrations This Visit     medroxyPROGESTERone  Acetate SUSY 150 mg     Admin Date 05/17/2024 Action Given Dose 150 mg Route Intramuscular Documented By Deidra Bang, Orlene, RN
